# Patient Record
Sex: Male | Born: 1955 | Race: White | Hispanic: No | Marital: Married | Smoking: Former smoker
Health system: Southern US, Community
[De-identification: ages and names within clinical notes are randomized; demographics above are authoritative.]

## PROBLEM LIST (undated history)

## (undated) DIAGNOSIS — I10 Essential (primary) hypertension: Secondary | ICD-10-CM

## (undated) DIAGNOSIS — E7800 Pure hypercholesterolemia, unspecified: Secondary | ICD-10-CM

## (undated) HISTORY — PX: CORNEAL TRANSPLANT: SHX108

## (undated) HISTORY — PX: RETINAL DETACHMENT SURGERY: SHX105

---

## 2000-11-19 ENCOUNTER — Encounter: Payer: Self-pay | Admitting: Emergency Medicine

## 2000-11-19 ENCOUNTER — Emergency Department (HOSPITAL_COMMUNITY): Admission: EM | Admit: 2000-11-19 | Discharge: 2000-11-19 | Payer: Self-pay | Admitting: Emergency Medicine

## 2007-07-10 ENCOUNTER — Emergency Department (HOSPITAL_COMMUNITY): Admission: EM | Admit: 2007-07-10 | Discharge: 2007-07-10 | Payer: Self-pay | Admitting: Emergency Medicine

## 2009-08-23 ENCOUNTER — Emergency Department (HOSPITAL_COMMUNITY): Admission: EM | Admit: 2009-08-23 | Discharge: 2009-08-23 | Payer: Self-pay | Admitting: Emergency Medicine

## 2010-10-18 ENCOUNTER — Encounter
Admission: RE | Admit: 2010-10-18 | Discharge: 2010-10-18 | Payer: Self-pay | Source: Home / Self Care | Attending: Family Medicine | Admitting: Family Medicine

## 2011-01-16 LAB — COMPREHENSIVE METABOLIC PANEL
ALT: 27 U/L (ref 0–53)
AST: 21 U/L (ref 0–37)
Albumin: 4.7 g/dL (ref 3.5–5.2)
Alkaline Phosphatase: 48 U/L (ref 39–117)
BUN: 8 mg/dL (ref 6–23)
CO2: 26 mEq/L (ref 19–32)
Calcium: 9.7 mg/dL (ref 8.4–10.5)
Chloride: 107 mEq/L (ref 96–112)
Creatinine, Ser: 0.86 mg/dL (ref 0.4–1.5)
GFR calc Af Amer: >60 mL/min (ref >60)
GFR calc non Af Amer: >60 mL/min (ref >60)
Glucose, Bld: 90 mg/dL (ref 70–99)
Potassium: 3.5 mEq/L (ref 3.5–5.1)
Sodium: 140 mEq/L (ref 135–145)
Total Bilirubin: 1.5 mg/dL — ABNORMAL HIGH (ref 0.3–1.2)
Total Protein: 7.1 g/dL (ref 6.0–8.3)

## 2011-01-16 LAB — POCT CARDIAC MARKERS
CKMB, poc: 1.2 ng/mL (ref 1.0–8.0)
Myoglobin, poc: 87.6 ng/mL (ref 12–200)
Troponin i, poc: <0.05 ng/mL (ref 0.00–0.09)

## 2012-03-19 ENCOUNTER — Other Ambulatory Visit: Payer: Self-pay | Admitting: Family Medicine

## 2012-03-19 ENCOUNTER — Ambulatory Visit
Admission: RE | Admit: 2012-03-19 | Discharge: 2012-03-19 | Disposition: A | Discharge status: Home / Self Care | Payer: PRIVATE HEALTH INSURANCE | Source: Ambulatory Visit | Attending: Family Medicine | Admitting: Family Medicine

## 2012-03-19 DIAGNOSIS — J329 Chronic sinusitis, unspecified: Secondary | ICD-10-CM

## 2012-03-19 DIAGNOSIS — M542 Cervicalgia: Secondary | ICD-10-CM

## 2012-03-25 ENCOUNTER — Other Ambulatory Visit: Payer: Self-pay | Admitting: Family Medicine

## 2012-03-25 DIAGNOSIS — R51 Headache: Secondary | ICD-10-CM

## 2012-04-02 ENCOUNTER — Ambulatory Visit
Admission: RE | Admit: 2012-04-02 | Discharge: 2012-04-02 | Disposition: A | Discharge status: Home / Self Care | Payer: PRIVATE HEALTH INSURANCE | Source: Ambulatory Visit | Attending: Family Medicine | Admitting: Family Medicine

## 2012-04-02 DIAGNOSIS — R51 Headache: Secondary | ICD-10-CM

## 2012-04-02 MED ORDER — GADOBENATE DIMEGLUMINE 529 MG/ML IV SOLN
20.0000 mL | Freq: Once | INTRAVENOUS | Status: AC | PRN
  Administered 2012-04-02: 20 mL via INTRAVENOUS

## 2012-06-09 ENCOUNTER — Ambulatory Visit
Admission: RE | Admit: 2012-06-09 | Discharge: 2012-06-09 | Disposition: A | Discharge status: Home / Self Care | Payer: PRIVATE HEALTH INSURANCE | Source: Ambulatory Visit | Attending: Family Medicine | Admitting: Family Medicine

## 2012-06-09 ENCOUNTER — Other Ambulatory Visit: Payer: Self-pay | Admitting: Family Medicine

## 2012-06-09 DIAGNOSIS — Z79899 Other long term (current) drug therapy: Secondary | ICD-10-CM

## 2013-07-06 ENCOUNTER — Ambulatory Visit
Admission: RE | Admit: 2013-07-06 | Discharge: 2013-07-06 | Disposition: A | Discharge status: Home / Self Care | Payer: PRIVATE HEALTH INSURANCE | Source: Ambulatory Visit | Attending: Family Medicine | Admitting: Family Medicine

## 2013-07-06 ENCOUNTER — Other Ambulatory Visit: Payer: Self-pay | Admitting: Family Medicine

## 2013-07-06 DIAGNOSIS — M549 Dorsalgia, unspecified: Secondary | ICD-10-CM

## 2014-05-23 ENCOUNTER — Emergency Department (HOSPITAL_COMMUNITY): Payer: PRIVATE HEALTH INSURANCE

## 2014-05-23 ENCOUNTER — Encounter (HOSPITAL_COMMUNITY): Payer: Self-pay | Admitting: Emergency Medicine

## 2014-05-23 ENCOUNTER — Emergency Department (INDEPENDENT_AMBULATORY_CARE_PROVIDER_SITE_OTHER)
Admission: EM | Admit: 2014-05-23 | Discharge: 2014-05-23 | Disposition: A | Discharge status: Other Acute Inpatient Hospital | Payer: PRIVATE HEALTH INSURANCE | Source: Home / Self Care | Attending: Emergency Medicine | Admitting: Emergency Medicine

## 2014-05-23 ENCOUNTER — Emergency Department (HOSPITAL_COMMUNITY)
Admission: EM | Admit: 2014-05-23 | Discharge: 2014-05-23 | Disposition: A | Discharge status: Home / Self Care | Payer: PRIVATE HEALTH INSURANCE | Attending: Emergency Medicine | Admitting: Emergency Medicine

## 2014-05-23 DIAGNOSIS — Z87891 Personal history of nicotine dependence: Secondary | ICD-10-CM | POA: Diagnosis not present

## 2014-05-23 DIAGNOSIS — I10 Essential (primary) hypertension: Secondary | ICD-10-CM | POA: Insufficient documentation

## 2014-05-23 DIAGNOSIS — Z862 Personal history of diseases of the blood and blood-forming organs and certain disorders involving the immune mechanism: Secondary | ICD-10-CM | POA: Diagnosis not present

## 2014-05-23 DIAGNOSIS — IMO0002 Reserved for concepts with insufficient information to code with codable children: Secondary | ICD-10-CM | POA: Diagnosis not present

## 2014-05-23 DIAGNOSIS — Z79899 Other long term (current) drug therapy: Secondary | ICD-10-CM | POA: Insufficient documentation

## 2014-05-23 DIAGNOSIS — R202 Paresthesia of skin: Secondary | ICD-10-CM

## 2014-05-23 DIAGNOSIS — M5442 Lumbago with sciatica, left side: Secondary | ICD-10-CM

## 2014-05-23 DIAGNOSIS — M5430 Sciatica, unspecified side: Secondary | ICD-10-CM | POA: Diagnosis not present

## 2014-05-23 DIAGNOSIS — R209 Unspecified disturbances of skin sensation: Secondary | ICD-10-CM | POA: Diagnosis not present

## 2014-05-23 DIAGNOSIS — R200 Anesthesia of skin: Secondary | ICD-10-CM

## 2014-05-23 DIAGNOSIS — Z8639 Personal history of other endocrine, nutritional and metabolic disease: Secondary | ICD-10-CM | POA: Insufficient documentation

## 2014-05-23 DIAGNOSIS — Z7982 Long term (current) use of aspirin: Secondary | ICD-10-CM | POA: Diagnosis not present

## 2014-05-23 DIAGNOSIS — G459 Transient cerebral ischemic attack, unspecified: Secondary | ICD-10-CM

## 2014-05-23 HISTORY — DX: Pure hypercholesterolemia, unspecified: E78.00

## 2014-05-23 HISTORY — DX: Essential (primary) hypertension: I10

## 2014-05-23 LAB — CBC
HEMATOCRIT: 45.2 % (ref 39.0–52.0)
Hemoglobin: 16.2 g/dL (ref 13.0–17.0)
MCH: 32.2 pg (ref 26.0–34.0)
MCHC: 35.8 g/dL (ref 30.0–36.0)
MCV: 89.9 fL (ref 78.0–100.0)
Platelets: 147 10*3/uL — ABNORMAL LOW (ref 150–400)
RBC: 5.03 MIL/uL (ref 4.22–5.81)
RDW: 13 % (ref 11.5–15.5)
WBC: 6.1 10*3/uL (ref 4.0–10.5)

## 2014-05-23 LAB — COMPREHENSIVE METABOLIC PANEL
ALBUMIN: 4.5 g/dL (ref 3.5–5.2)
ALT: 23 U/L (ref 0–53)
ANION GAP: 14 (ref 5–15)
AST: 25 U/L (ref 0–37)
Alkaline Phosphatase: 43 U/L (ref 39–117)
BUN: 9 mg/dL (ref 6–23)
CALCIUM: 10.1 mg/dL (ref 8.4–10.5)
CHLORIDE: 104 meq/L (ref 96–112)
CO2: 26 mEq/L (ref 19–32)
CREATININE: 0.78 mg/dL (ref 0.50–1.35)
GFR calc Af Amer: >90 mL/min (ref >90)
GFR calc non Af Amer: >90 mL/min (ref >90)
Glucose, Bld: 85 mg/dL (ref 70–99)
Potassium: 4.2 mEq/L (ref 3.7–5.3)
Sodium: 144 mEq/L (ref 137–147)
TOTAL PROTEIN: 7 g/dL (ref 6.0–8.3)
Total Bilirubin: 0.8 mg/dL (ref 0.3–1.2)

## 2014-05-23 LAB — PROTIME-INR
INR: 0.93 (ref 0.00–1.49)
Prothrombin Time: 12.5 seconds (ref 11.6–15.2)

## 2014-05-23 LAB — DIFFERENTIAL
BASOS ABS: 0 10*3/uL (ref 0.0–0.1)
BASOS PCT: 0 % (ref 0–1)
EOS ABS: 0.1 10*3/uL (ref 0.0–0.7)
EOS PCT: 2 % (ref 0–5)
Lymphocytes Relative: 35 % (ref 12–46)
Lymphs Abs: 2.1 10*3/uL (ref 0.7–4.0)
MONO ABS: 0.6 10*3/uL (ref 0.1–1.0)
Monocytes Relative: 9 % (ref 3–12)
NEUTROS ABS: 3.2 10*3/uL (ref 1.7–7.7)
Neutrophils Relative %: 54 % (ref 43–77)

## 2014-05-23 LAB — I-STAT CHEM 8, ED
BUN: 9 mg/dL (ref 6–23)
Calcium, Ion: 1.29 mmol/L — ABNORMAL HIGH (ref 1.12–1.23)
Chloride: 104 mEq/L (ref 96–112)
Creatinine, Ser: 0.9 mg/dL (ref 0.50–1.35)
GLUCOSE: 81 mg/dL (ref 70–99)
HEMATOCRIT: 48 % (ref 39.0–52.0)
HEMOGLOBIN: 16.3 g/dL (ref 13.0–17.0)
POTASSIUM: 4 meq/L (ref 3.7–5.3)
SODIUM: 140 meq/L (ref 137–147)
TCO2: 26 mmol/L (ref 0–100)

## 2014-05-23 LAB — I-STAT TROPONIN, ED: TROPONIN I, POC: 0 ng/mL (ref 0.00–0.08)

## 2014-05-23 LAB — APTT: APTT: 22 s — AB (ref 24–37)

## 2014-05-23 LAB — CBG MONITORING, ED: Glucose-Capillary: 77 mg/dL (ref 70–99)

## 2014-05-23 MED ORDER — METOCLOPRAMIDE HCL 5 MG/ML IJ SOLN
10.0000 mg | Freq: Once | INTRAMUSCULAR | Status: AC
  Administered 2014-05-23: 10 mg via INTRAVENOUS
  Filled 2014-05-23: qty 2

## 2014-05-23 MED ORDER — CYCLOBENZAPRINE HCL 10 MG PO TABS: 10.0000 mg | ORAL_TABLET | Freq: Three times a day (TID) | ORAL | Status: AC | PRN

## 2014-05-23 MED ORDER — SODIUM CHLORIDE 0.9 % IV BOLUS (SEPSIS): 250.0000 mL | Freq: Once | INTRAVENOUS | Status: DC

## 2014-05-23 MED ORDER — SODIUM CHLORIDE 0.9 % IV BOLUS (SEPSIS)
1000.0000 mL | Freq: Once | INTRAVENOUS | Status: AC
  Administered 2014-05-23: 1000 mL via INTRAVENOUS

## 2014-05-23 MED ORDER — METOCLOPRAMIDE HCL 5 MG/ML IJ SOLN: 10.0000 mg | Freq: Once | INTRAMUSCULAR | Status: DC

## 2014-05-23 MED ORDER — DIPHENHYDRAMINE HCL 50 MG/ML IJ SOLN
12.5000 mg | Freq: Once | INTRAMUSCULAR | Status: AC
  Administered 2014-05-23: 12.5 mg via INTRAVENOUS
  Filled 2014-05-23: qty 1

## 2014-05-23 MED ORDER — IBUPROFEN 600 MG PO TABS: 600.0000 mg | ORAL_TABLET | Freq: Four times a day (QID) | ORAL | Status: AC | PRN

## 2014-05-23 MED ORDER — METOCLOPRAMIDE HCL 5 MG/ML IJ SOLN: 20.0000 mg | Freq: Once | INTRAVENOUS | Status: DC

## 2014-05-23 NOTE — ED Notes (Signed)
Pt ambulate to bathroom without difficulty.

## 2014-05-23 NOTE — ED Provider Notes (Signed)
Chief Complaint   Chief Complaint  Patient presents with  . Dizziness     History of Present Illness   Seth Wolf is a 58 year old male with high blood pressure and hypercholesterolemia who has had a two-day history of intermittent episodes of dizziness and left-sided numbness and tingling. The dizziness began first. This would last for seconds to minutes at a time and was usually positional. He denies any spinning vertigo. He just feels lightheaded. He denies any ear symptoms such as earache, difficulty hearing, ringing in ears. Today around 3 PM he noted some numbness of his left neck, left arm, and left leg. This is now gone away. His jaw felt a little bit stiff, but he denies any headache, diplopia, blurry vision, difficulty with speech or swallowing. He had no difficulty with ambulation or weakness of the left side of his body or his face. He's never had any spells like this before. No history of strokes or TIAs. He denies chest pain or shortness of breath.  Review of Systems   Other than as noted above, the patient denies any of the following symptoms: Systemic:  No fever, chills, photophobia, stiff neck. Eye:  No blurred vision or diplopia. ENT:  No nasal congestion or rhinorrhea.  No jaw claudication. Neuro:  No paresthesias, loss of consciousness, seizure activity, muscle weakness, trouble with coordination or gait, trouble speaking or swallowing. Psych:  No depression, anxiety or trouble sleeping.  PMFSH   Past medical history, family history, social history, meds, and allergies were reviewed.  He has hypercholesterolemia and hypertension. He takes simvastatin and lisinopril.  Physical Examination     Vital signs:  BP 135/84  Pulse 67  Temp(Src) 97.4 F (36.3 C) (Oral)  Resp 16  SpO2 98% General:  Alert and oriented.  In no distress. Eye:  Lids and conjunctivas normal.  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  Fundi benign with normal discs and vessels. ENT:  No cranial or facial tenderness to palpation.  TMs and canals clear.  Nasal mucosa was normal and uncongested without any drainage. No intra oral lesions, pharynx clear, mucous membranes moist, dentition normal. Neck:  Supple, full ROM, no tenderness to palpation.  No adenopathy or mass. No carotid bruit. Lungs: Clear to auscultation. Heart: Regular rhythm, no gallop or murmur. Neuro:  Alert and orented times 3.  Speech was clear, fluent, and appropriate.  Cranial nerves intact. No pronator drift, muscle strength normal. Finger to nose normal.  DTRs were 2+ and symmetrical.Station and gait were normal.  Romberg's sign was normal.  Able to perform tandem gait well. Psych:  Normal affect.  Assessment   The encounter diagnosis was Transient cerebral ischemia, unspecified transient cerebral ischemia type.  The left-sided numbness is concerning for a TIA. He feels fine right now and is neurologically intact. I do not think he is a code stroke patient.  Plan   The patient was transferred to the ED via shuttle in stable condition.  Medical Decision Making:  2 day history of intermittent episodes of light headedness, and left sided paresthesias.  No weakness, visual symptoms, or difficulty with speech.  He feels fine now.  Neuro exam normal.  Am concerned about TIA.  Will send by shuttle.  Do not think he is a code stroke.      Reuben Likesavid C Brigitta Pricer,  MD 05/23/14 1943

## 2014-05-23 NOTE — ED Notes (Signed)
Patient transported to CT 

## 2014-05-23 NOTE — ED Provider Notes (Signed)
CSN: [id number]     Arrival date & time 05/23/14  1948 History   First MD Initiated Contact with Patient 05/23/14 2014     Chief Complaint  Patient presents with  . Dizziness  . Numbness     (Consider location/radiation/quality/duration/timing/severity/associated sxs/prior Treatment) HPI  Seth Wolf is a 58 y.o. male with a past medical history of hypertension, high cholesterol, and eye surgery presenting for numbness, dizziness, and lightheadedness. Patient endorses intermittent lightheadedness and dizziness since yesterday. States he thinks his left ear has been plugged, symptoms are positional in nature. States he has had vertigo in the past but this feels different. Patient states that he was feeling symptoms yesterday when he was outside working in his yard. Feels as though he was well-hydrated at the time. He is also had chronic, ongoing numbness and tingling in his left hand. States that he has frequent tightness in his left trapezius area. He has not been taking medication for this. Today around 5:20 PM he was driving his vehicle and had a brief period of numbness in his left foot. This resolved within 30 minutes. He states that he sits "funny" in his car, and may have pinched I nerve in his leg. He went to an urgent care center for further evaluation, and was referred here due to concern for CVA. Patient denies chest pain, shortness of breath, headache, nausea, vomiting, abdominal pain, fever, chills, or other associated symptoms. Patient denies any difficulty walking, or muscle weakness, denies slurred speech, mental status changes, or vision changes. Denies bowel or bladder changes, perianal and perigenital anesthesia.  Endorses chronic L sided lumbar back pain.     Past Medical History  Diagnosis Date  . Hypertension   . High cholesterol    Past Surgical History  Procedure Laterality Date  . Corneal transplant    . Retinal detachment surgery     No family history on  file. History  Substance Use Topics  . Smoking status: Former Games developer  . Smokeless tobacco: Not on file  . Alcohol Use: No    Review of Systems  Constitutional: Negative.   HENT: Negative.   Eyes: Negative.   Respiratory: Negative.   Cardiovascular: Negative.   Gastrointestinal: Negative.   Endocrine: Negative.   Genitourinary: Negative.   Musculoskeletal: Negative.   Skin: Negative.   Allergic/Immunologic: Negative.   Neurological: Positive for dizziness, light-headedness and numbness.  Hematological: Negative.   Psychiatric/Behavioral: Negative.       Allergies  Bee venom  Home Medications   Prior to Admission medications   Medication Sig Start Date End Date Taking? Authorizing Provider  aspirin 81 MG tablet Take 81 mg by mouth daily.   Yes Historical Provider, MD  calcium-vitamin D (OSCAL WITH D) 500-200 MG-UNIT per tablet Take 1 tablet by mouth daily.   Yes Historical Provider, MD  Cyanocobalamin (VITAMIN B-12 PO) Take 1 tablet by mouth daily.   Yes Historical Provider, MD  lisinopril (PRINIVIL,ZESTRIL) 10 MG tablet Take 10 mg by mouth daily.   Yes Historical Provider, MD  Multiple Vitamin (MULTIVITAMIN WITH MINERALS) TABS tablet Take 1 tablet by mouth daily.   Yes Historical Provider, MD  prednisoLONE acetate (PRED FORTE) 1 % ophthalmic suspension Place 1 drop into the right eye 4 (four) times daily.   Yes Historical Provider, MD  PREDNISOLONE ACETATE OP Place 1 drop into the right eye 2 (two) times daily.   Yes Historical Provider, MD  timolol (BETIMOL) 0.5 % ophthalmic solution Place 1 drop into  the right eye 2 (two) times daily.    Yes Historical Provider, MD  zolpidem (AMBIEN CR) 6.25 MG CR tablet Take 6.25 mg by mouth at bedtime as needed for sleep.   Yes Historical Provider, MD  cyclobenzaprine (FLEXERIL) 10 MG tablet Take 1 tablet (10 mg total) by mouth 3 (three) times daily as needed for muscle spasms. 05/23/14   Gavin Pound, MD  ibuprofen (ADVIL,MOTRIN) 600  MG tablet Take 1 tablet (600 mg total) by mouth every 6 (six) hours as needed. 05/23/14   Gavin Pound, MD   BP 153/84  Pulse 123  Temp(Src) 98.1 F (36.7 C) (Oral)  Resp 18  SpO2 98% Physical Exam  Nursing note and vitals reviewed. Constitutional: He is oriented to person, place, and time. He appears well-developed and well-nourished. No distress.  HENT:  Head: Normocephalic and atraumatic.  Right Ear: Tympanic membrane and external ear normal.  Left Ear: Tympanic membrane and external ear normal.  Nose: Nose normal.  Mouth/Throat: Oropharynx is clear and moist. No oropharyngeal exudate.  Eyes: Conjunctivae and EOM are normal. Pupils are equal, round, and reactive to light. Right eye exhibits no discharge. Left eye exhibits no discharge. No scleral icterus.  Neck: Normal range of motion. Neck supple. No JVD present. No tracheal deviation present. No thyromegaly present.  Cardiovascular: Normal rate, regular rhythm, normal heart sounds and intact distal pulses.  Exam reveals no gallop and no friction rub.   No murmur heard. Pulmonary/Chest: Effort normal and breath sounds normal. No stridor. No respiratory distress. He has no wheezes. He has no rales. He exhibits no tenderness.  Abdominal: Soft. He exhibits no distension. There is no tenderness. There is no rebound and no guarding.  Musculoskeletal: Normal range of motion. He exhibits no edema and no tenderness.       Back:       Legs: Lymphadenopathy:    He has no cervical adenopathy.  Neurological: He is alert and oriented to person, place, and time. He has normal strength and normal reflexes. He is not disoriented. He displays no atrophy, no tremor and normal reflexes. No cranial nerve deficit or sensory deficit. He exhibits normal muscle tone. He displays no seizure activity. Coordination and gait normal. GCS eye subscore is 4. GCS verbal subscore is 5. GCS motor subscore is 6. He displays no Babinski's sign on the right side. He  displays no Babinski's sign on the left side.  No sensory deficit noted.  Skin: Skin is warm and dry. No rash noted. He is not diaphoretic. No erythema. No pallor.  Psychiatric: He has a normal mood and affect. His behavior is normal. Judgment and thought content normal.    ED Course  Procedures (including critical care time) Labs Review Labs Reviewed  APTT - Abnormal; Notable for the following:    aPTT 22 (*)    All other components within normal limits  CBC - Abnormal; Notable for the following:    Platelets 147 (*)    All other components within normal limits  I-STAT CHEM 8, ED - Abnormal; Notable for the following:    Calcium, Ion 1.29 (*)    All other components within normal limits  PROTIME-INR  DIFFERENTIAL  COMPREHENSIVE METABOLIC PANEL  CBG MONITORING, ED  I-STAT TROPOININ, ED    Imaging Review Ct Head (brain) Wo Contrast  05/23/2014   CLINICAL DATA:  Dizziness and numbness.  EXAM: CT HEAD WITHOUT CONTRAST  TECHNIQUE: Contiguous axial images were obtained from the base of the skull  through the vertex without intravenous contrast.  COMPARISON:  MRI of the brain performed 04/02/2012  FINDINGS: There is no evidence of acute infarction, mass lesion, or intra- or extra-axial hemorrhage on CT.  The posterior fossa, including the cerebellum, brainstem and fourth ventricle, is within normal limits. The third and lateral ventricles, and basal ganglia are unremarkable in appearance. The cerebral hemispheres are symmetric in appearance, with normal gray-white differentiation. No mass effect or midline shift is seen.  There is no evidence of fracture; visualized osseous structures are unremarkable in appearance. The patient is status post right-sided scleral banding. The orbits are otherwise within normal limits. The paranasal sinuses and mastoid air cells are well-aerated. No significant soft tissue abnormalities are seen.  IMPRESSION: No acute intracranial pathology seen on CT.    Electronically Signed   By: Roanna RaiderJeffery  Chang M.D.   On: 05/23/2014 22:14     EKG Interpretation   Date/Time:  Monday May 23 2014 20:02:21 EDT Ventricular Rate:  65 PR Interval:  162 QRS Duration: 110 QT Interval:  408 QTC Calculation: 424 R Axis:   5 Text Interpretation:  Normal sinus rhythm Incomplete right bundle branch  block No significant change since last tracing Confirmed by Denton LankSTEINL  MD,  Caryn BeeKEVIN ([id number]) on 05/23/2014 8:14:29 PM      MDM   Final diagnoses:  Numbness and tingling  Left-sided low back pain with left-sided sciatica   On exam patient has palpable muscle stiffness and spasm in his left trapezius area. Palpation of this area reproduces his left arm tingling. Also palpation of the sciatic notch reproduces pain in his leg. His history of dizziness and lightheadedness associated with positional changes, may be reflective of dehydration, especially as he was having symptoms yesterday and it was very hot outside. Will get screening labs, and head CT due to patient's symptoms. Additional consideration would be for, atypical presentation of vertigo, or CVA/TIA. CVA appears less likely as patient's symptoms are reproducible on physical examination, and are likely explanation for her symptoms. Patient was not made code stroke as his symptoms have been intermittent for greater than 24 hours.  Additional consideration would be atypical migraine, and possibly peripheral nerve impingement due to muscle spasms, or foraminal disease. Patient has no symptoms concerning for cauda equina.  We'll give a normal saline bolus, as well as Reglan and Benadryl to see if this improves patient's symptoms while work up is pending.  Following IV fluids, Reglan, and Benadryl patient states he is feeling much better, numbness in his left arm is somewhat improved. On repeat examination palpation left trapezius area demonstrates less muscle stiffness. Patient's lightheadedness and dizziness is improved as  well. Patient improvement of symptoms and reproducibility of symptoms of peripheral maneuvers low concern for CVA/TIA at this time. Have advised the patient to seek further outpatient management with his PCP and to followup later this week for additional testing as deemed necessary. He may need further advanced imaging to evaluate for possible foraminal issues, or nerve conduction studies.  Will d/c with NSAIDs and muscle relaxants.  Patient and family given return precautions for CVA, and given recommendations for low back pain.  Advised to return for worsening symptoms including chest pain, shortness of breath, severe headache, intractable nausea or vomiting, fever, or chills, inability to take medications, or other acute concerns.  Advised to follow up with PCP in 2 days.  Patient and family in agreement with and expressed understanding of follow plan, plan of care, and return precautions.  All  questions answered prior to discharge.  Patient was discharged in stable condition with family, ambulating without difficulty.   Patient care was discussed with my attending, Dr. Denton Lank.      Gavin Pound, MD 05/24/14 [phone]

## 2014-05-23 NOTE — ED Notes (Addendum)
C/o onset couple days duration of dizziness, worse since aprox 3 pm today , with added c/o of left leg numbness, tingling. Has ongoing issues w left arm numbness and tingling from his work related activity . C/o dizziness is making him nauseated. Denies vomiting

## 2014-05-23 NOTE — ED Notes (Signed)
Pt. Reports around 1500 today started having dizziness and left sided numbness. States it is resolving but still there. Grips equal, ambulated with steady gait.

## 2014-05-23 NOTE — Discharge Instructions (Signed)
We have determined that your problem requires further evaluation in the emergency department.  We will take care of your transport there.  Once at the emergency department, you will be evaluated by a provider and they will order whatever treatment or tests they deem necessary.  We cannot guarantee that they will do any specific test or do any specific treatment.   Transient Ischemic Attack A transient ischemic attack (TIA) is a "warning stroke" that causes stroke-like symptoms. Unlike a stroke, a TIA does not cause permanent damage to the brain. The symptoms of a TIA can happen very fast and do not last long. It is important to know the symptoms of a TIA and what to do. This can help prevent a major stroke or death. CAUSES   A TIA is caused by a temporary blockage in an artery in the brain or neck (carotid artery). The blockage does not allow the brain to get the blood supply it needs and can cause different symptoms. The blockage can be caused by either:  A blood clot.  Fatty buildup (plaque) in a neck or brain artery. RISK FACTORS  High blood pressure (hypertension).  High cholesterol.  Diabetes mellitus.  Heart disease.  The build up of plaque in the blood vessels (peripheral artery disease or atherosclerosis).  The build up of plaque in the blood vessels providing blood and oxygen to the brain (carotid artery stenosis).  An abnormal heart rhythm (atrial fibrillation).  Obesity.  Smoking.  Taking oral contraceptives (especially in combination with smoking).  Physical inactivity.  A diet high in fats, salt (sodium), and calories.  Alcohol use.  Use of illegal drugs (especially cocaine and methamphetamine).  Being male.  Being African American.  Being over the age of 82.  Family history of stroke.  Previous history of blood clots, stroke, TIA, or heart attack.  Sickle cell disease. SYMPTOMS  TIA symptoms are the same as a stroke but are temporary. These symptoms  usually develop suddenly, or may be newly present upon awakening from sleep:  Sudden weakness or numbness of the face, arm, or leg, especially on one side of the body.  Sudden trouble walking or difficulty moving arms or legs.  Sudden confusion.  Sudden personality changes.  Trouble speaking (aphasia) or understanding.  Difficulty swallowing.  Sudden trouble seeing in one or both eyes.  Double vision.  Dizziness.  Loss of balance or coordination.  Sudden severe headache with no known cause.  Trouble reading or writing.  Loss of bowel or bladder control.  Loss of consciousness. DIAGNOSIS  Your caregiver may be able to determine the presence or absence of a TIA based on your symptoms, history, and physical exam. Computed tomography (CT scan) of the brain is usually performed to help identify a TIA. Other tests may be done to diagnose a TIA. These tests may include:  Electrocardiography.  Continuous heart monitoring.  Echocardiography.  Carotid ultrasonography.  Magnetic resonance imaging (MRI).  A scan of the brain circulation.  Blood tests. PREVENTION  The risk of a TIA can be decreased by appropriately treating high blood pressure, high cholesterol, diabetes, heart disease, and obesity and by quitting smoking, limiting alcohol, and staying physically active. TREATMENT  Time is of the essence. Since the symptoms of TIA are the same as a stroke, it is important to seek treatment as soon as possible because you may need a medicine to dissolve the clot (thrombolytic) that cannot be given if too much time has passed. Treatment options vary.  Treatment options may include rest, oxygen, intravenous (IV) fluids, and medicines to thin the blood (anticoagulants). Medicines and diet may be used to address diabetes, high blood pressure, and other risk factors. Measures will be taken to prevent short-term and long-term complications, including infection from breathing foreign  material into the lungs (aspiration pneumonia), blood clots in the legs, and falls. Treatment options include procedures to either remove plaque in the carotid arteries or dilate carotid arteries that have narrowed due to plaque. Those procedures are:  Carotid endarterectomy.  Carotid angioplasty and stenting. HOME CARE INSTRUCTIONS   Take all medicines prescribed by your caregiver. Follow the directions carefully. Medicines may be used to control risk factors for a stroke. Be sure you understand all your medicine instructions.  You may be told to take aspirin or the anticoagulant warfarin. Warfarin needs to be taken exactly as instructed.  Taking too much or too little warfarin is dangerous. Too much warfarin increases the risk of bleeding. Too little warfarin continues to allow the risk for blood clots. While taking warfarin, you will need to have regular blood tests to measure your blood clotting time. A PT blood test measures how long it takes for blood to clot. Your PT is used to calculate another value called an INR. Your PT and INR help your caregiver to adjust your dose of warfarin. The dose can change for many reasons. It is critically important that you take warfarin exactly as prescribed.  Many foods, especially foods high in vitamin K can interfere with warfarin and affect the PT and INR. Foods high in vitamin K include spinach, kale, broccoli, cabbage, collard and turnip greens, brussels sprouts, peas, cauliflower, seaweed, and parsley as well as beef and pork liver, green tea, and soybean oil. You should eat a consistent amount of foods high in vitamin K. Avoid major changes in your diet, or notify your caregiver before changing your diet. Arrange a visit with a dietitian to answer your questions.  Many medicines can interfere with warfarin and affect the PT and INR. You must tell your caregiver about any and all medicines you take, this includes all vitamins and supplements. Be  especially cautious with aspirin and anti-inflammatory medicines. Do not take or discontinue any prescribed or over-the-counter medicine except on the advice of your caregiver or pharmacist.  Warfarin can have side effects, such as excessive bruising or bleeding. You will need to hold pressure over cuts for longer than usual. Your caregiver or pharmacist will discuss other potential side effects.  Avoid sports or activities that may cause injury or bleeding.  Be mindful when shaving, flossing your teeth, or handling sharp objects.  Alcohol can change the body's ability to handle warfarin. It is best to avoid alcoholic drinks or consume only very small amounts while taking warfarin. Notify your caregiver if you change your alcohol intake.  Notify your dentist or other caregivers before procedures.  Eat a diet that includes 5 or more servings of fruits and vegetables each day. This may reduce the risk of stroke. Certain diets may be prescribed to address high blood pressure, high cholesterol, diabetes, or obesity.  A low-sodium, low-saturated fat, low-trans fat, low-cholesterol diet is recommended to manage high blood pressure.  A low-saturated fat, low-trans fat, low-cholesterol, and high-fiber diet may control cholesterol levels.  A controlled-carbohydrate, controlled-sugar diet is recommended to manage diabetes.  A reduced-calorie, low-sodium, low-saturated fat, low-trans fat, low-cholesterol diet is recommended to manage obesity.  Maintain a healthy weight.  Stay physically active.  It is recommended that you get at least 30 minutes of activity on most or all days.  Do not smoke.  Limit alcohol use even if you are not taking warfarin. Moderate alcohol use is considered to be:  No more than 2 drinks each day for men.  No more than 1 drink each day for nonpregnant women.  Stop drug abuse.  Home safety. A safe home environment is important to reduce the risk of falls. Your caregiver  may arrange for specialists to evaluate your home. Having grab bars in the bedroom and bathroom is often important. Your caregiver may arrange for equipment to be used at home, such as raised toilets and a seat for the shower.  Follow all instructions for follow-up with your caregiver. This is very important. This includes any referrals and lab tests. Proper follow up can prevent a stroke or another TIA from occurring. SEEK MEDICAL CARE IF:  You have personality changes.  You have difficulty swallowing.  You are seeing double.  You have dizziness.  You have a fever.  You have skin breakdown. SEEK IMMEDIATE MEDICAL CARE IF:  Any of these symptoms may represent a serious problem that is an emergency. Do not wait to see if the symptoms will go away. Get medical help right away. Call your local emergency services (911 in [place] Do not drive yourself to the hospital.  You have sudden weakness or numbness of the face, arm, or leg, especially on one side of the body.  You have sudden trouble walking or difficulty moving arms or legs.  You have sudden confusion.  You have trouble speaking (aphasia) or understanding.  You have sudden trouble seeing in one or both eyes.  You have a loss of balance or coordination.  You have a sudden, severe headache with no known cause.  You have new chest pain or an irregular heartbeat.  You have a partial or total loss of consciousness. MAKE SURE YOU:   Understand these instructions.  Will watch your condition.  Will get help right away if you are not doing well or get worse. Document Released: 07/10/2005 Document Revised: 10/05/2013 Document Reviewed: 01/05/2014 [facility] Patient Information 2015 [employer] [place], [place]. This information is not intended to replace advice given to you by your health care provider. Make sure you discuss any questions you have with your health care provider.

## 2014-05-23 NOTE — Discharge Instructions (Signed)

## 2014-05-25 NOTE — ED Provider Notes (Signed)
I saw and evaluated the patient, reviewed the resident's note and I agree with the findings and plan.   EKG Interpretation   Date/Time:  Monday May 23 2014 20:02:21 EDT Ventricular Rate:  65 PR Interval:  162 QRS Duration: 110 QT Interval:  408 QTC Calculation: 424 R Axis:   5 Text Interpretation:  Normal sinus rhythm Incomplete right bundle branch  block No significant change since last tracing Confirmed by Ewell Benassi  MD,  Caryn BeeKEVIN ([id number]) on 05/23/2014 8:14:29 PM      Pt c/o numbness/tingling to ring and small finger left hand and toes 4/5 of left foot. No change in speech or vision. No weakness or loss of functional ability.  No headache. Motor intact bil. stre 5/5. Labs. Ct.    Suzi RootsKevin E Adrienne Delay, MD 05/25/14 [phone]

## 2015-01-16 IMAGING — CT CT HEAD W/O CM
1 series · 16 of 30 positions shown, 20 images · non-contrast
Comparison: MRI of the brain performed 04/02/2012

CLINICAL DATA: Dizziness and numbness.

EXAM:
CT HEAD WITHOUT CONTRAST
TECHNIQUE: Contiguous axial images were obtained from the base of the skull
through the vertex without intravenous contrast.

[Series 2: head 5.0 h30s · axial · 0.43mm/px · z∈[-84,+66]mm · 16 of 34 slices shown, 20 images]
[im 2/34  brain]
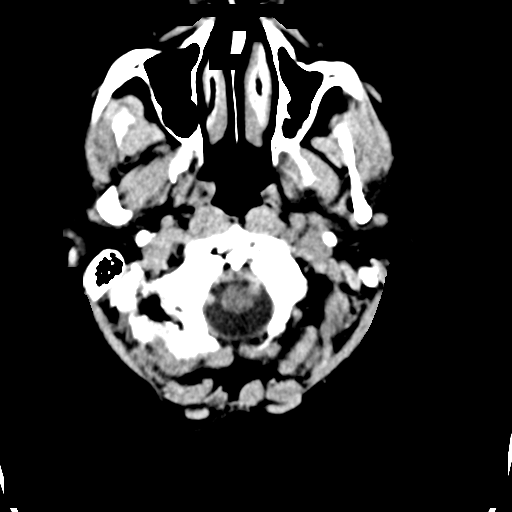
[im 2/34  bone]
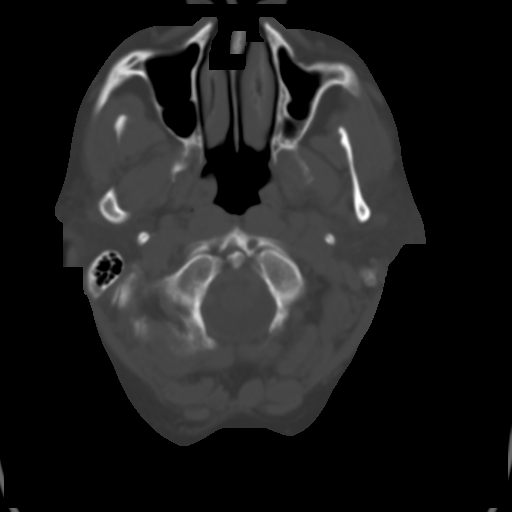
[im 4/34  brain]
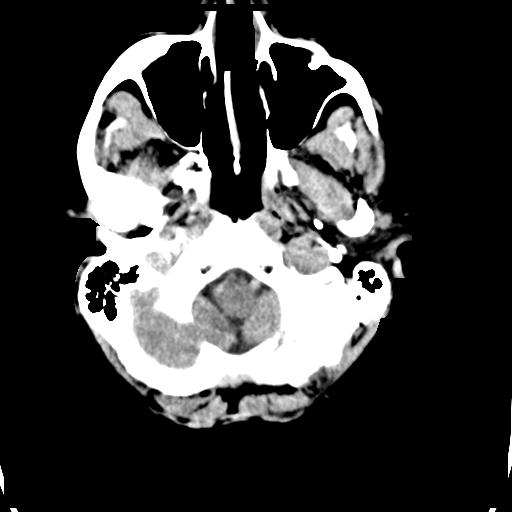
[im 6/34  brain]
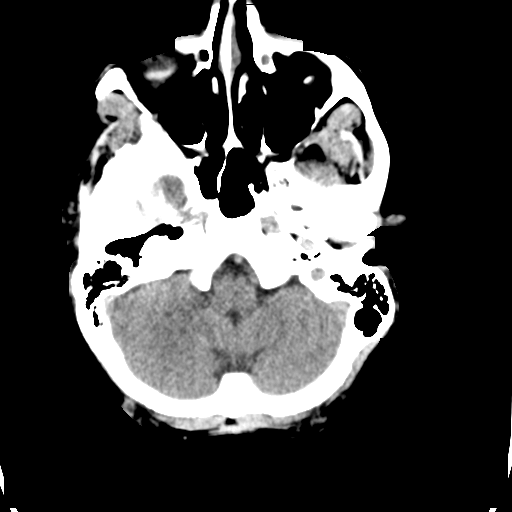
[im 8/34  brain]
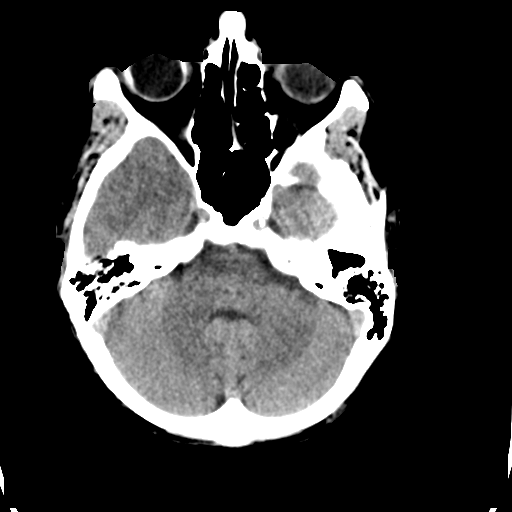
[im 10/34  brain]
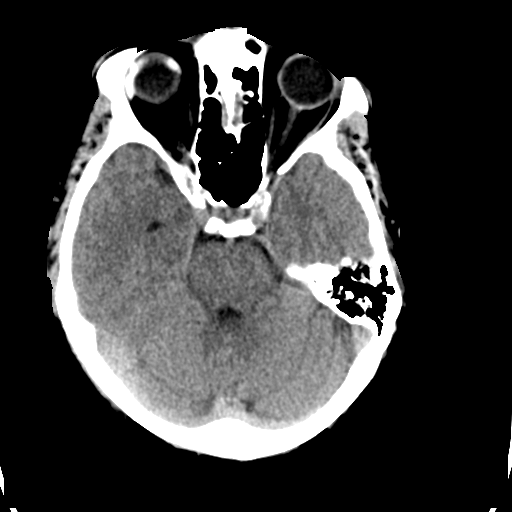
[im 10/34  bone]
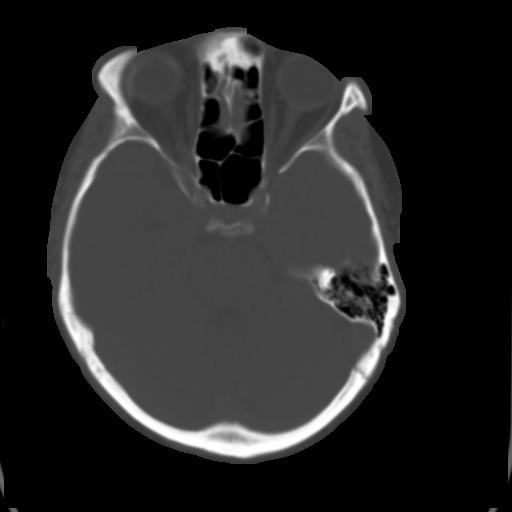
[im 12/34  brain]
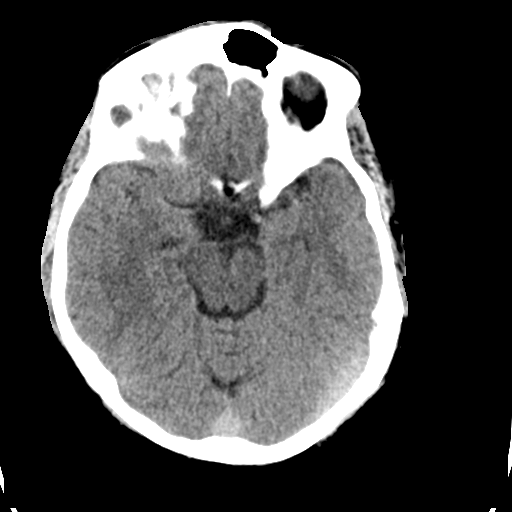
[im 14/34  brain]
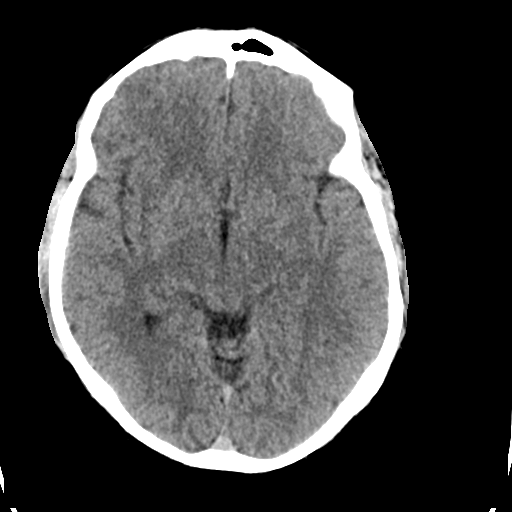
[im 16/34  brain]
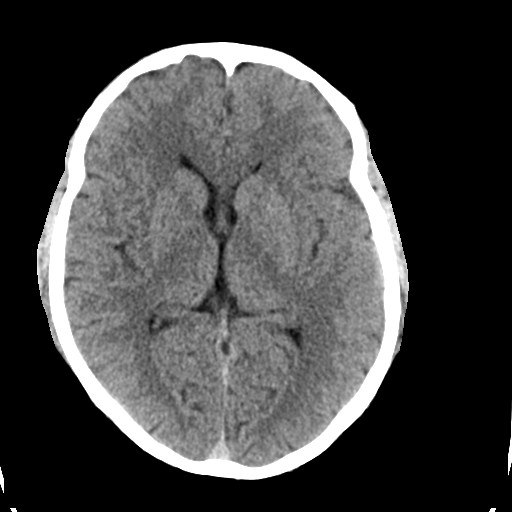
[im 18/34  brain]
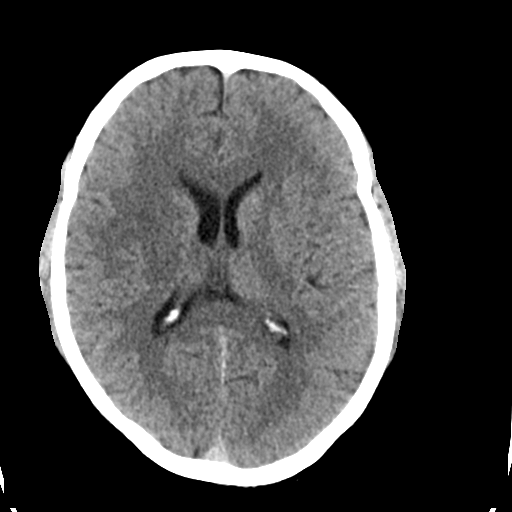
[im 18/34  bone]
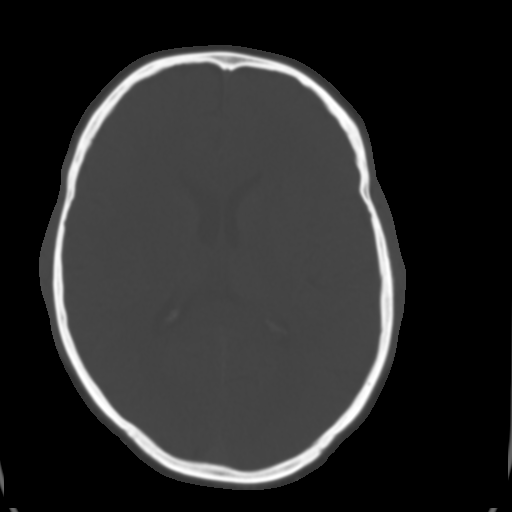
[im 20/34  brain]
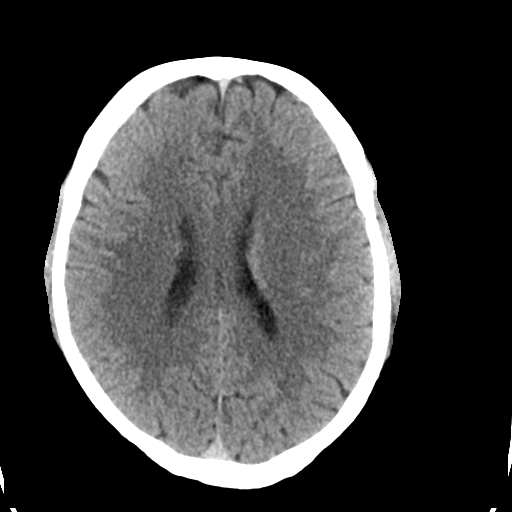
[im 22/34  brain]
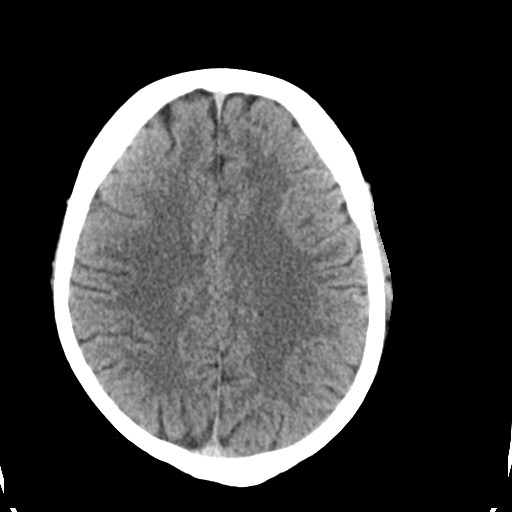
[im 24/34  brain]
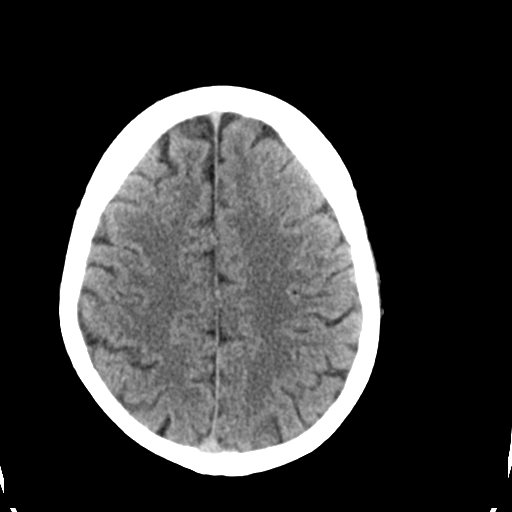
[im 26/34  brain]
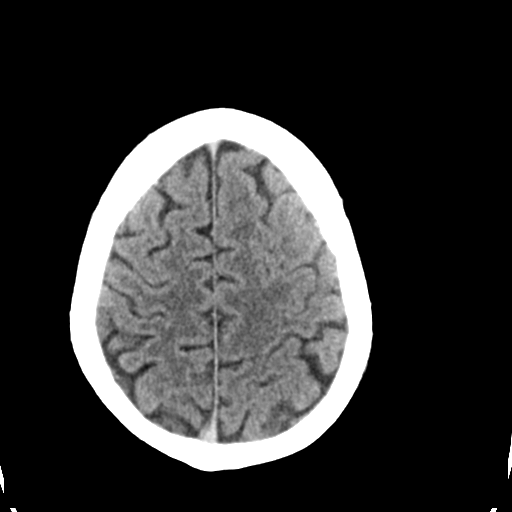
[im 26/34  bone]
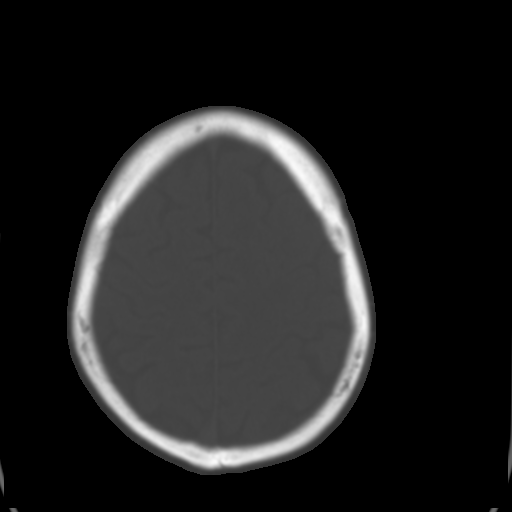
[im 28/34  brain]
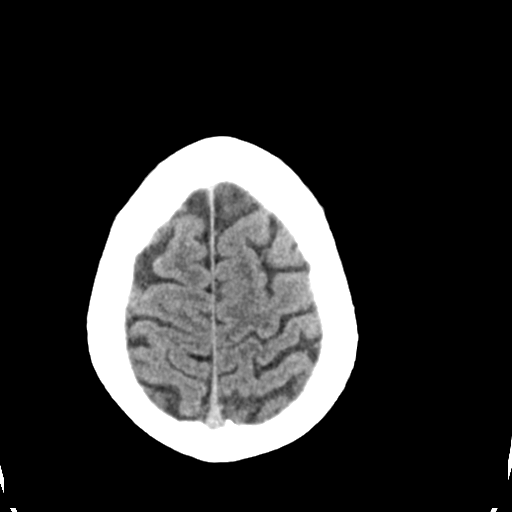
[im 30/34  brain]
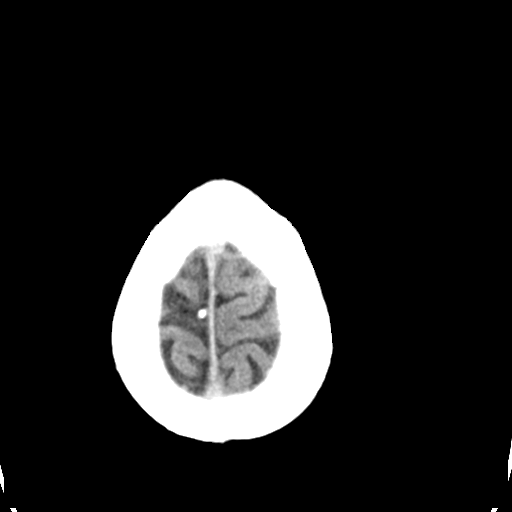
[im 32/34  brain]
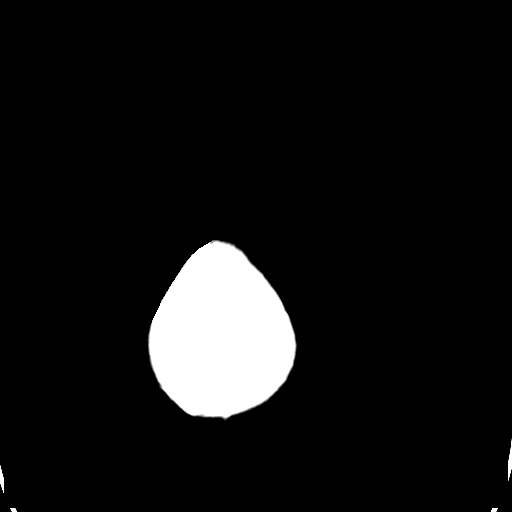

[16 of 30 positions shown; findings below may reference images not displayed]

FINDINGS: There is no evidence of acute infarction, mass lesion, or intra- or
extra-axial hemorrhage on CT.

The posterior fossa, including the cerebellum, brainstem and fourth
ventricle, is within normal limits. The third and lateral
ventricles, and basal ganglia are unremarkable in appearance. The
cerebral hemispheres are symmetric in appearance, with normal
gray-white differentiation. No mass effect or midline shift is seen.

There is no evidence of fracture; visualized osseous structures are
unremarkable in appearance. The patient is status post right-sided
scleral banding. The orbits are otherwise within normal limits. The
paranasal sinuses and mastoid air cells are well-aerated. No
significant soft tissue abnormalities are seen.
IMPRESSION: No acute intracranial pathology seen on CT.

## 2016-11-26 ENCOUNTER — Ambulatory Visit
Admission: RE | Admit: 2016-11-26 | Discharge: 2016-11-26 | Disposition: A | Discharge status: Home / Self Care | Payer: 59 | Source: Ambulatory Visit | Attending: Family Medicine | Admitting: Family Medicine

## 2016-11-26 ENCOUNTER — Other Ambulatory Visit: Payer: Self-pay | Admitting: Family Medicine

## 2016-11-26 DIAGNOSIS — M25552 Pain in left hip: Secondary | ICD-10-CM

## 2017-07-22 IMAGING — DX DG HIP (WITH OR WITHOUT PELVIS) 2-3V*L*
2 series · 2 of 2 positions shown · non-contrast
Comparison: None.

CLINICAL DATA: Intermittent posterior left hip pain for 2-3 months.
No known injury.

EXAM:
DG HIP (WITH OR WITHOUT PELVIS) 2-3V LEFT

[dg hip unilat w or w/o pelvis 2-3 views  (1 of 2)]
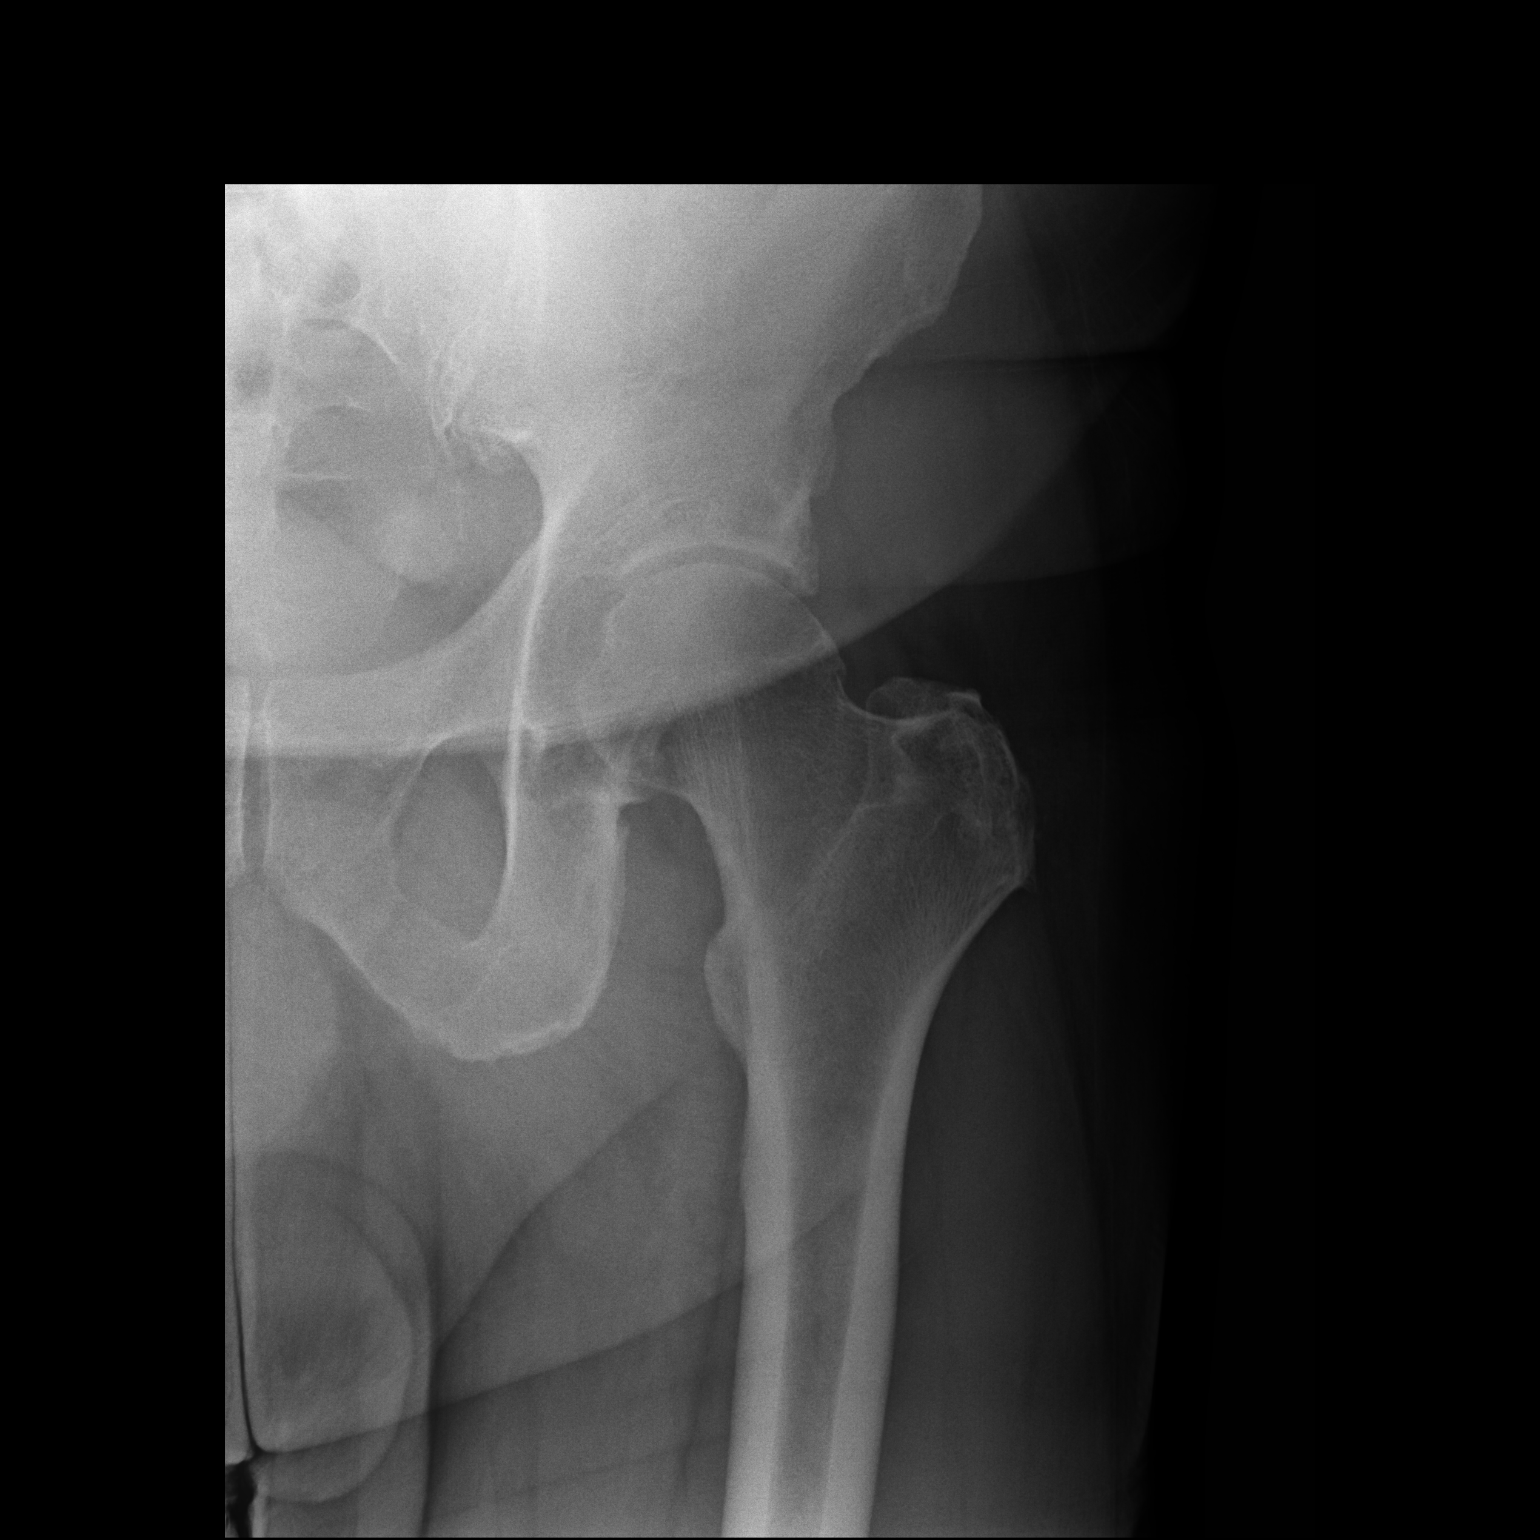

[dg hip unilat w or w/o pelvis 2-3 views  (2 of 2)]
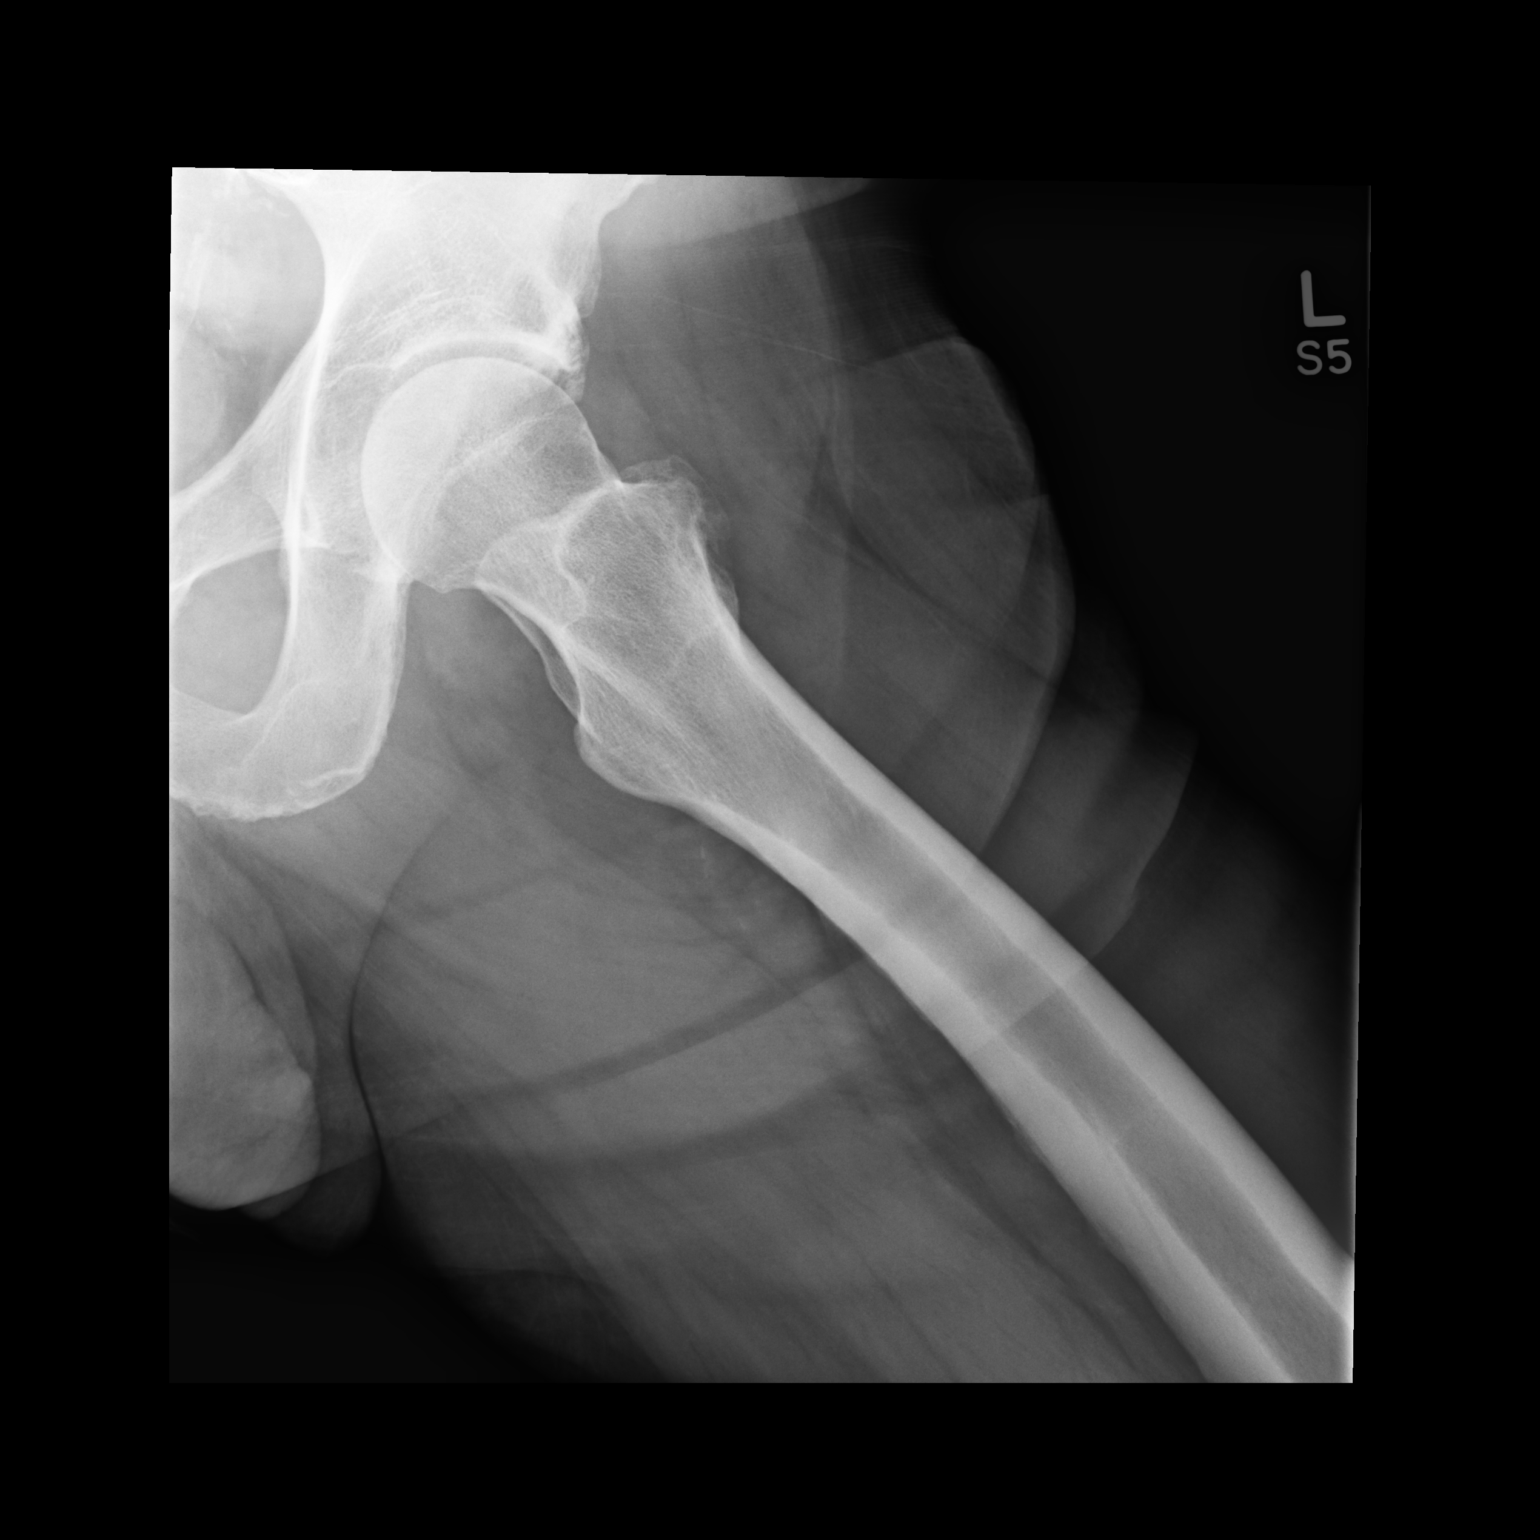

[2 of 2 positions shown; findings below may reference images not displayed]

FINDINGS: Mild degenerative changes in the left hip with joint space narrowing
and spurring. No acute bony abnormality. Specifically, no fracture,
subluxation, or dislocation. Soft tissues are intact.
IMPRESSION: No acute bony abnormality.
